# Patient Record
Sex: Male | Born: 1965 | Race: Black or African American | Hispanic: No | Marital: Married | Smoking: Current every day smoker
Health system: Southern US, Community
[De-identification: ages and names within clinical notes are randomized; demographics above are authoritative.]

## PROBLEM LIST (undated history)

## (undated) DIAGNOSIS — M419 Scoliosis, unspecified: Secondary | ICD-10-CM

---

## 2004-09-08 ENCOUNTER — Emergency Department: Payer: Self-pay | Admitting: Emergency Medicine

## 2009-12-06 ENCOUNTER — Emergency Department: Payer: Self-pay | Admitting: Emergency Medicine

## 2011-03-07 ENCOUNTER — Emergency Department: Payer: Self-pay | Admitting: Internal Medicine

## 2012-06-06 ENCOUNTER — Emergency Department: Payer: Self-pay | Admitting: Emergency Medicine

## 2012-06-06 LAB — COMPREHENSIVE METABOLIC PANEL
Albumin: 4.1 g/dL (ref 3.4–5.0)
Alkaline Phosphatase: 71 U/L (ref 50–136)
Anion Gap: 3 — ABNORMAL LOW (ref 7–16)
Bilirubin,Total: 0.9 mg/dL (ref 0.2–1.0)
Calcium, Total: 9.9 mg/dL (ref 8.5–10.1)
Chloride: 106 mmol/L (ref 98–107)
Co2: 29 mmol/L (ref 21–32)
EGFR (African American): >60
EGFR (Non-African Amer.): >60
SGPT (ALT): 22 U/L (ref 12–78)
Sodium: 138 mmol/L (ref 136–145)
Total Protein: 7.8 g/dL (ref 6.4–8.2)

## 2012-06-06 LAB — URINALYSIS, COMPLETE
Blood: NEGATIVE
Glucose,UR: NEGATIVE mg/dL (ref 0–75)
Ketone: NEGATIVE
Nitrite: NEGATIVE
Protein: NEGATIVE
RBC,UR: 1 /HPF (ref 0–5)
Specific Gravity: 1.027 (ref 1.003–1.030)
Squamous Epithelial: <1
WBC UR: <1 /HPF (ref 0–5)

## 2012-06-06 LAB — CBC
HCT: 42.5 % (ref 40.0–52.0)
HGB: 14.1 g/dL (ref 13.0–18.0)
MCH: 30.6 pg (ref 26.0–34.0)
MCHC: 33.3 g/dL (ref 32.0–36.0)
MCV: 92 fL (ref 80–100)
RDW: 12.8 % (ref 11.5–14.5)
WBC: 5.3 10*3/uL (ref 3.8–10.6)

## 2012-06-06 LAB — LIPASE, BLOOD: Lipase: 359 U/L (ref 73–393)

## 2012-06-06 LAB — TROPONIN I: Troponin-I: <0.02 ng/mL

## 2016-05-26 ENCOUNTER — Encounter: Payer: Self-pay | Admitting: Emergency Medicine

## 2016-05-26 ENCOUNTER — Emergency Department
Admission: EM | Admit: 2016-05-26 | Discharge: 2016-05-26 | Disposition: A | Discharge status: Home / Self Care | Payer: BLUE CROSS/BLUE SHIELD | Attending: Emergency Medicine | Admitting: Emergency Medicine

## 2016-05-26 DIAGNOSIS — R110 Nausea: Secondary | ICD-10-CM

## 2016-05-26 DIAGNOSIS — F17200 Nicotine dependence, unspecified, uncomplicated: Secondary | ICD-10-CM | POA: Diagnosis not present

## 2016-05-26 DIAGNOSIS — R1084 Generalized abdominal pain: Secondary | ICD-10-CM | POA: Diagnosis not present

## 2016-05-26 LAB — URINALYSIS, COMPLETE (UACMP) WITH MICROSCOPIC
Bacteria, UA: NONE SEEN
Bilirubin Urine: NEGATIVE
GLUCOSE, UA: NEGATIVE mg/dL
Ketones, ur: NEGATIVE mg/dL
Leukocytes, UA: NEGATIVE
Nitrite: NEGATIVE
PROTEIN: NEGATIVE mg/dL
SPECIFIC GRAVITY, URINE: 1.003 — AB (ref 1.005–1.030)
SQUAMOUS EPITHELIAL / LPF: NONE SEEN
pH: 6 (ref 5.0–8.0)

## 2016-05-26 LAB — COMPREHENSIVE METABOLIC PANEL
ALBUMIN: 4 g/dL (ref 3.5–5.0)
ALK PHOS: 86 U/L (ref 38–126)
ALT: 19 U/L (ref 17–63)
AST: 25 U/L (ref 15–41)
Anion gap: 6 (ref 5–15)
BUN: 10 mg/dL (ref 6–20)
CALCIUM: 8.8 mg/dL — AB (ref 8.9–10.3)
CHLORIDE: 108 mmol/L (ref 101–111)
CO2: 27 mmol/L (ref 22–32)
CREATININE: 1.04 mg/dL (ref 0.61–1.24)
GFR calc Af Amer: >60 mL/min (ref >60)
GFR calc non Af Amer: >60 mL/min (ref >60)
GLUCOSE: 113 mg/dL — AB (ref 65–99)
Potassium: 4.1 mmol/L (ref 3.5–5.1)
SODIUM: 141 mmol/L (ref 135–145)
Total Bilirubin: 0.8 mg/dL (ref 0.3–1.2)
Total Protein: 7.1 g/dL (ref 6.5–8.1)

## 2016-05-26 LAB — CBC
HCT: 41.3 % (ref 40.0–52.0)
Hemoglobin: 13.8 g/dL (ref 13.0–18.0)
MCH: 31.2 pg (ref 26.0–34.0)
MCHC: 33.5 g/dL (ref 32.0–36.0)
MCV: 93.2 fL (ref 80.0–100.0)
PLATELETS: 182 10*3/uL (ref 150–440)
RBC: 4.43 MIL/uL (ref 4.40–5.90)
RDW: 13.2 % (ref 11.5–14.5)
WBC: 5.1 10*3/uL (ref 3.8–10.6)

## 2016-05-26 LAB — LIPASE, BLOOD: Lipase: 23 U/L (ref 11–51)

## 2016-05-26 MED ORDER — ONDANSETRON 4 MG PO TBDP
8.0000 mg | ORAL_TABLET | Freq: Once | ORAL | Status: AC
  Administered 2016-05-26: 8 mg via ORAL
  Filled 2016-05-26: qty 2

## 2016-05-26 MED ORDER — ONDANSETRON 4 MG PO TBDP: 4.0000 mg | ORAL_TABLET | Freq: Three times a day (TID) | ORAL | 0 refills | Status: DC | PRN

## 2016-05-26 NOTE — ED Triage Notes (Signed)
Pt reports generalized abdominal pain, diarrhea and nausea for four days. Pt reports feeling lightheaded as well. No apparent distress noted in triage.Denies vomiting.

## 2016-05-26 NOTE — ED Provider Notes (Signed)
[facility] Emergency Department Provider Note  ____________________________________________   First MD Initiated Contact with Patient 05/26/16 [id number]     (approximate)  I have reviewed the triage vital signs and the nursing notes.   HISTORY  Chief Complaint Abdominal Pain; Diarrhea; and Nausea    HPI Evan Pham is a 51 y.o. male who self presents to the emergency department with 4-5 days of mild diffuse abdominal cramping discomfort. Nothing makes the pain better or worse. It is nonradiating. He has nausea but has not vomited. She's had several loose stools that he feels are improving. He has no abdominal surgical history. He denies chest pain or shortness of breath. He takes omeprazole daily for esophageal reflux although has never been diagnosed via endoscopy.   History reviewed. No pertinent past medical history.  There are no active problems to display for this patient.   History reviewed. No pertinent surgical history.  Prior to Admission medications   Medication Sig Start Date End Date Taking? Authorizing Provider  ondansetron (ZOFRAN ODT) 4 MG disintegrating tablet Take 1 tablet (4 mg total) by mouth every 8 (eight) hours as needed for nausea or vomiting. 05/26/16   Merrily Brittleifenbark, Daekwon Beswick, MD    Allergies Tylenol [acetaminophen]  No family history on file.  Social History Social History  Substance Use Topics  . Smoking status: Current Every Day Smoker  . Smokeless tobacco: Never Used  . Alcohol use No    Review of Systems Constitutional: No fever/chills Eyes: No visual changes. ENT: No sore throat. Cardiovascular: Denies chest pain. Respiratory: Denies shortness of breath. Gastrointestinal: Positive abdominal pain.  Positive nausea, no vomiting.  Positive diarrhea.  No constipation. Genitourinary: Negative for dysuria. Musculoskeletal: Negative for back pain. Skin: Negative for rash. Neurological: Negative for headaches, focal weakness  or numbness.   ____________________________________________   PHYSICAL EXAM:  VITAL SIGNS: ED Triage Vitals [05/26/16 0705]  Enc Vitals Group     BP (!) 117/57     Pulse Rate (!) 58     Resp 18     Temp 98.2 F (36.8 C)     Temp Source Oral     SpO2 100 %     Weight 150 lb (68 kg)     Height 6\' 1"  (1.854 m)     Head Circumference      Peak Flow      Pain Score 5     Pain Loc      Pain Edu?      Excl. in GC?     Constitutional: Alert and oriented x 4 well appearing nontoxic no diaphoresis speaks in full, clear sentences Eyes: PERRL EOMI. Head: Atraumatic. Nose: No congestion/rhinnorhea. Mouth/Throat: No trismus Neck: No stridor.   Cardiovascular: Normal rate, regular rhythm. Grossly normal heart sounds.  Good peripheral circulation. Respiratory: Normal respiratory effort.  No retractions. Lungs CTAB and moving good air Gastrointestinal: Soft nondistended nontender no rebound or guarding no peritonitis no McBurney's tenderness negative Rovsing's no costovertebral tenderness negative Murphy's Musculoskeletal: No lower extremity edema   Neurologic:  Normal speech and language. No gross focal neurologic deficits are appreciated. Skin:  Skin is warm, dry and intact. No rash noted. Psychiatric: Mood and affect are normal. Speech and behavior are normal.    ____________________________________________   DIFFERENTIAL  Biliary colic, cholecystitis, diverticulitis, appendicitis, volvulus, gastric reflux, lower lobar pneumonia ____________________________________________   LABS (all labs ordered are listed, but only abnormal results are displayed)  Labs Reviewed  COMPREHENSIVE METABOLIC PANEL -  Abnormal; Notable for the following:       Result Value   Glucose, Bld 113 (*)    Calcium 8.8 (*)    All other components within normal limits  URINALYSIS, COMPLETE (UACMP) WITH MICROSCOPIC - Abnormal; Notable for the following:    Color, Urine STRAW (*)    APPearance CLEAR  (*)    Specific Gravity, Urine 1.003 (*)    Hgb urine dipstick SMALL (*)    All other components within normal limits  LIPASE, BLOOD  CBC    Labs unremarkable __________________________________________  EKG  ED ECG REPORT I, Merrily Brittle, the attending physician, personally viewed and interpreted this ECG.  Date: 05/26/2016 Rate: 61 Rhythm: normal sinus rhythm QRS Axis: normal Intervals: normal ST/T Wave abnormalities: Upward sloping diffuse ST elevation consistent with benign early re-pole Conduction Disturbances: none Narrative Interpretation: unremarkable  ____________________________________________ ________________________________________   PROCEDURES  Procedure(s) performed: no  Procedures  Critical Care performed: no  Observation: no ____________________________________________   INITIAL IMPRESSION / ASSESSMENT AND PLAN / ED COURSE  Pertinent labs & imaging results that were available during my care of the patient were reviewed by me and considered in my medical decision making (see chart for details).  By the time I saw the patient he had artery had labs which were normal. His abdominal exam is very benign and he is in no pain whatsoever right now. He does report some persistent nausea. We discussed the risks and benefits of CT scanning and ultrasound versus watchful waiting. He is opted for outpatient management with a 2 day abdominal recheck which I think is entirely reasonable. he has no primary care physician swell refer him to one. he is discharged home in improved condition.      ____________________________________________   FINAL CLINICAL IMPRESSION(S) / ED DIAGNOSES  Final diagnoses:  Generalized abdominal pain  Nausea      NEW MEDICATIONS STARTED DURING THIS VISIT:  New Prescriptions   ONDANSETRON (ZOFRAN ODT) 4 MG DISINTEGRATING TABLET    Take 1 tablet (4 mg total) by mouth every 8 (eight) hours as needed for nausea or vomiting.       Note:  This document was prepared using Dragon voice recognition software and may include unintentional dictation errors.     Merrily Brittle, MD 05/26/16 0930

## 2016-05-26 NOTE — Discharge Instructions (Signed)
Please follow-up with the primary care physician on Friday for recheck. Return to the emergency department sooner for any new or worsening symptoms such as fevers, chills, worsening pain, if you cannot eat or drink, or for any other concerns whatsoever.  It was a pleasure to take care of you today, and thank you for coming to our emergency department.  If you have any questions or concerns before leaving please ask the nurse to grab me and I'm more than happy to go through your aftercare instructions again.  If you were prescribed any opioid pain medication today such as Norco, Vicodin, Percocet, morphine, hydrocodone, or oxycodone please make sure you do not drive when you are taking this medication as it can alter your ability to drive safely.  If you have any concerns once you are home that you are not improving or are in fact getting worse before you can make it to your follow-up appointment, please do not hesitate to call 911 and come back for further evaluation.  Merrily BrittleNeil Lander Eslick MD  Results for orders placed or performed during the hospital encounter of 05/26/16  Lipase, blood  Result Value Ref Range   Lipase 23 11 - 51 U/L  Comprehensive metabolic panel  Result Value Ref Range   Sodium 141 135 - 145 mmol/L   Potassium 4.1 3.5 - 5.1 mmol/L   Chloride 108 101 - 111 mmol/L   CO2 27 22 - 32 mmol/L   Glucose, Bld 113 (H) 65 - 99 mg/dL   BUN 10 6 - 20 mg/dL   Creatinine, Ser 1.611.04 0.61 - 1.24 mg/dL   Calcium 8.8 (L) 8.9 - 10.3 mg/dL   Total Protein 7.1 6.5 - 8.1 g/dL   Albumin 4.0 3.5 - 5.0 g/dL   AST 25 15 - 41 U/L   ALT 19 17 - 63 U/L   Alkaline Phosphatase 86 38 - 126 U/L   Total Bilirubin 0.8 0.3 - 1.2 mg/dL   GFR calc non Af Amer >60 >60 mL/min   GFR calc Af Amer >60 >60 mL/min   Anion gap 6 5 - 15  CBC  Result Value Ref Range   WBC 5.1 3.8 - 10.6 K/uL   RBC 4.43 4.40 - 5.90 MIL/uL   Hemoglobin 13.8 13.0 - 18.0 g/dL   HCT 09.641.3 04.540.0 - 40.952.0 %   MCV 93.2 80.0 - 100.0 fL   MCH  31.2 26.0 - 34.0 pg   MCHC 33.5 32.0 - 36.0 g/dL   RDW 81.113.2 91.411.5 - 78.214.5 %   Platelets 182 150 - 440 K/uL  Urinalysis, Complete w Microscopic  Result Value Ref Range   Color, Urine STRAW (A) YELLOW   APPearance CLEAR (A) CLEAR   Specific Gravity, Urine 1.003 (L) 1.005 - 1.030   pH 6.0 5.0 - 8.0   Glucose, UA NEGATIVE NEGATIVE mg/dL   Hgb urine dipstick SMALL (A) NEGATIVE   Bilirubin Urine NEGATIVE NEGATIVE   Ketones, ur NEGATIVE NEGATIVE mg/dL   Protein, ur NEGATIVE NEGATIVE mg/dL   Nitrite NEGATIVE NEGATIVE   Leukocytes, UA NEGATIVE NEGATIVE   RBC / HPF 0-5 0 - 5 RBC/hpf   WBC, UA 0-5 0 - 5 WBC/hpf   Bacteria, UA NONE SEEN NONE SEEN   Squamous Epithelial / LPF NONE SEEN NONE SEEN

## 2016-11-28 ENCOUNTER — Emergency Department
Admission: EM | Admit: 2016-11-28 | Discharge: 2016-11-28 | Disposition: A | Discharge status: Home / Self Care | Payer: BLUE CROSS/BLUE SHIELD | Attending: Emergency Medicine | Admitting: Emergency Medicine

## 2016-11-28 ENCOUNTER — Other Ambulatory Visit: Payer: Self-pay

## 2016-11-28 DIAGNOSIS — F17200 Nicotine dependence, unspecified, uncomplicated: Secondary | ICD-10-CM | POA: Insufficient documentation

## 2016-11-28 DIAGNOSIS — M5440 Lumbago with sciatica, unspecified side: Secondary | ICD-10-CM

## 2016-11-28 DIAGNOSIS — M5441 Lumbago with sciatica, right side: Secondary | ICD-10-CM | POA: Insufficient documentation

## 2016-11-28 DIAGNOSIS — M79661 Pain in right lower leg: Secondary | ICD-10-CM | POA: Diagnosis present

## 2016-11-28 DIAGNOSIS — M62830 Muscle spasm of back: Secondary | ICD-10-CM | POA: Insufficient documentation

## 2016-11-28 DIAGNOSIS — M5431 Sciatica, right side: Secondary | ICD-10-CM

## 2016-11-28 MED ORDER — PREDNISONE 10 MG (21) PO TBPK: ORAL_TABLET | ORAL | 0 refills | Status: DC

## 2016-11-28 MED ORDER — ORPHENADRINE CITRATE ER 100 MG PO TB12: 100.0000 mg | ORAL_TABLET | Freq: Two times a day (BID) | ORAL | 0 refills | Status: AC | PRN

## 2016-11-28 MED ORDER — DEXAMETHASONE SODIUM PHOSPHATE 10 MG/ML IJ SOLN
10.0000 mg | Freq: Once | INTRAMUSCULAR | Status: AC
  Administered 2016-11-28: 10 mg via INTRAMUSCULAR
  Filled 2016-11-28: qty 1

## 2016-11-28 NOTE — ED Provider Notes (Signed)
[facility] Emergency Department Provider Note   ____________________________________________   I have reviewed the triage vital signs and the nursing notes.   HISTORY  Chief Complaint Leg Pain    HPI Evan Pham is a 51 y.o. male presents to the emergency department with lumbar back pain and pain radiating down the right lower extremity to the knee.  Patient denies any recent fall or injuries causing the above symptoms.  Patient has a history of scoliosis and chronic back pain.  Patient reports usual flareups calls right side lower back pain into the glutes that resolves in several days however this episode has worsened with pain radiating into the anterior lateral right lower extremity just above the knee.  Patient denies any sensation or strength changes.  Patient also denies any changes in bowel or bladder function or saddle anesthesia. Patient denies fever, chills, headache, vision changes, chest pain, chest tightness, shortness of breath, abdominal pain, nausea and vomiting.  History reviewed. No pertinent past medical history.  There are no active problems to display for this patient.   History reviewed. No pertinent surgical history.  Prior to Admission medications   Medication Sig Start Date End Date Taking? Authorizing Provider  ondansetron (ZOFRAN ODT) 4 MG disintegrating tablet Take 1 tablet (4 mg total) by mouth every 8 (eight) hours as needed for nausea or vomiting. 05/26/16   Merrily Brittleifenbark, Neil, MD  orphenadrine (NORFLEX) 100 MG tablet Take 1 tablet (100 mg total) 2 (two) times daily as needed for up to 14 days by mouth for muscle spasms. 11/28/16 12/12/16  Little, Traci M, PA-C  predniSONE (STERAPRED UNI-PAK 21 TAB) 10 MG (21) TBPK tablet Take 6 tablets on day 1. Take 5 tablets on day 2. Take 4 tablets on day 3. Take 3 tablets on day 4. Take 2 tablets on day 5. Take 1 tablets on day 6. 11/28/16   Little, Traci M, PA-C    Allergies Tylenol  [acetaminophen]  No family history on file.  Social History Social History   Tobacco Use  . Smoking status: Current Every Day Smoker  . Smokeless tobacco: Never Used  Substance Use Topics  . Alcohol use: No  . Drug use: No    Review of Systems Constitutional: Negative for fever/chills Eyes: No visual changes. Cardiovascular: Denies chest pain. Respiratory:  Denies shortness of breath. Musculoskeletal: Positive for lumbar back pain and right lower extremity pain. Skin: Negative for rash. Neurological: Negative for headaches.  Negative focal weakness or numbness. ____________________________________________   PHYSICAL EXAM:  VITAL SIGNS: Patient Vitals for the past 24 hrs:  BP Temp Temp src Pulse Resp SpO2 Height Weight  11/28/16 1304 128/84 98.1 F (36.7 C) Oral 64 16 100 % - -  11/28/16 1103 (!) 145/77 (!) 97.5 F (36.4 C) Oral 70 17 100 % 6\' 1"  (1.854 m) 68 kg (150 lb)    Constitutional: Alert and oriented. Well appearing and in no acute distress.  Eyes: Conjunctivae are normal. PERRL. Head: Normocephalic and atraumatic. Cardiovascular: Normal rate, regular rhythm.  Good peripheral circulation. Respiratory: Normal respiratory effort without tachypnea or retractions.  Musculoskeletal: Thoracolumbar scoliosis present.  Tenderness along the lumbar spinous process.  Palpable tenderness along lumbar paraspinals glut medius. Right lower extremity radicular pain to the knee without changes in sensation or strength.  Neurologic: Normal speech and language.  Skin:  Skin is warm, dry and intact. No rash noted. Psychiatric: Mood and affect are normal. Speech and behavior are normal. Patient exhibits  appropriate insight and judgement.  ____________________________________________   LABS (all labs ordered are listed, but only abnormal results are displayed)  Labs Reviewed - No data to  display ____________________________________________  EKG none ____________________________________________  RADIOLOGY none ____________________________________________   PROCEDURES  Procedure(s) performed: no    Critical Care performed: no ____________________________________________   INITIAL IMPRESSION / ASSESSMENT AND PLAN / ED COURSE  Pertinent labs & imaging results that were available during my care of the patient were reviewed by me and considered in my medical decision making (see chart for details).  Patient presents to emergency department with lumbar back and right lower extremity pain without trauma. History and physical exam findings are reassuring symptoms are consistent with exacerbation of lumbar back pain with sciatica. Patient noted improvement of symptoms following decadron and norflex given during the course of care in the emergency department. Patient will be prescribed prednisone taper and norflex as needed for muscle spasms. Patient advised to follow up with his neurosurgeon spine provider or return to the emergency department if symptoms significantly worsened. Patient informed of clinical course, understand medical decision-making process, and agree with plan.  ____________________________________________   FINAL CLINICAL IMPRESSION(S) / ED DIAGNOSES  Final diagnoses:  Back pain of lumbar region with sciatica  Sciatica of right side  Spasm of muscle of lower back       NEW MEDICATIONS STARTED DURING THIS VISIT:  This SmartLink is deprecated. Use AVSMEDLIST instead to display the medication list for a patient.   Note:  This document was prepared using Dragon voice recognition software and may include unintentional dictation errors.    Clois ComberLittle, Traci M, PA-C 11/28/16 1604    Clois ComberLittle, Traci M, PA-C 11/28/16 1605    Schaevitz, Myra Rudeavid Matthew, MD 11/29/16 Serena [id number]

## 2016-11-28 NOTE — Discharge Instructions (Signed)
Take medication as prescribed. Return to emergency department if symptoms significantly worsen

## 2016-11-28 NOTE — ED Triage Notes (Signed)
Pt c/o right upper leg pain for the past 2 weeks. Denies injury. States he has a lot of back pain that is chronic

## 2016-11-28 NOTE — ED Notes (Signed)
Patient does not appear to be in any acute distress at time of discharge. Patient ambulatory to lobby with steady gate. Patient denies any comments or concerns regarding discharge.  

## 2016-11-28 NOTE — ED Notes (Signed)
Reports he has scoliosis, right upper right leg pain 9/10, "feels like my veins are popping out"

## 2017-03-24 ENCOUNTER — Other Ambulatory Visit: Payer: Self-pay

## 2017-03-24 ENCOUNTER — Ambulatory Visit (HOSPITAL_COMMUNITY)
Admission: EM | Admit: 2017-03-24 | Discharge: 2017-03-24 | Disposition: A | Discharge status: Home / Self Care | Payer: BLUE CROSS/BLUE SHIELD | Attending: Internal Medicine | Admitting: Internal Medicine

## 2017-03-24 ENCOUNTER — Encounter (HOSPITAL_COMMUNITY): Payer: Self-pay | Admitting: Emergency Medicine

## 2017-03-24 DIAGNOSIS — K0889 Other specified disorders of teeth and supporting structures: Secondary | ICD-10-CM

## 2017-03-24 HISTORY — DX: Scoliosis, unspecified: M41.9

## 2017-03-24 MED ORDER — TRAMADOL HCL 50 MG PO TABS: 50.0000 mg | ORAL_TABLET | Freq: Four times a day (QID) | ORAL | 0 refills | Status: DC | PRN

## 2017-03-24 NOTE — ED Provider Notes (Signed)
[facility]    CSN: [id number] Arrival date & time: 03/24/17  1013     History   Chief Complaint Chief Complaint  Patient presents with  . Dental Pain    HPI Evan Pham is a 52 y.o. male no concerning past medical history presenting today with dental pain.  He states that for the past 1-2 weeks he has had pain in his right lower wisdom tooth.  States that this tooth has been bad for a while, but recently worsened while he was eating food.  Patient does have dental insurance, and has an appointment this upcoming Monday to be evaluated by dentistry.  He denies any swelling or facial swelling.  He is having headaches.  Has been using Advil over-the-counter for pain.  HPI  Past Medical History:  Diagnosis Date  . Scoliosis     There are no active problems to display for this patient.   History reviewed. No pertinent surgical history.     Home Medications    Prior to Admission medications   Medication Sig Start Date End Date Taking? Authorizing Provider  ondansetron (ZOFRAN ODT) 4 MG disintegrating tablet Take 1 tablet (4 mg total) by mouth every 8 (eight) hours as needed for nausea or vomiting. 05/26/16   Merrily Brittle, MD  predniSONE (STERAPRED UNI-PAK 21 TAB) 10 MG (21) TBPK tablet Take 6 tablets on day 1. Take 5 tablets on day 2. Take 4 tablets on day 3. Take 3 tablets on day 4. Take 2 tablets on day 5. Take 1 tablets on day 6. 11/28/16   Little, Traci M, PA-C  traMADol (ULTRAM) 50 MG tablet Take 1 tablet (50 mg total) by mouth every 6 (six) hours as needed for severe pain. 03/24/17   Wieters, Junius Creamer, PA-C    Family History History reviewed. No pertinent family history.  Social History Social History   Tobacco Use  . Smoking status: Current Every Day Smoker  . Smokeless tobacco: Never Used  Substance Use Topics  . Alcohol use: No  . Drug use: No     Allergies   Tylenol [acetaminophen]   Review of Systems Review of Systems  Constitutional:  Negative for fatigue and fever.  HENT: Positive for dental problem. Negative for sore throat and trouble swallowing.   Respiratory: Negative for shortness of breath.   Cardiovascular: Negative for chest pain.  Gastrointestinal: Negative for nausea and vomiting.  Neurological: Positive for headaches. Negative for dizziness and light-headedness.     Physical Exam Triage Vital Signs ED Triage Vitals  Enc Vitals Group     BP 03/24/17 1059 127/78     Pulse Rate 03/24/17 1059 84     Resp 03/24/17 1059 18     Temp 03/24/17 1059 98.4 F (36.9 C)     Temp Source 03/24/17 1059 Oral     SpO2 03/24/17 1059 99 %     Weight --      Height --      Head Circumference --      Peak Flow --      Pain Score 03/24/17 1057 10     Pain Loc --      Pain Edu? --      Excl. in GC? --    No data found.  Updated Vital Signs BP 127/78 (BP Location: Left Arm)   Pulse 84   Temp 98.4 F (36.9 C) (Oral)   Resp 18   SpO2 99%   Visual Acuity Right Eye  Distance:   Left Eye Distance:   Bilateral Distance:    Right Eye Near:   Left Eye Near:    Bilateral Near:     Physical Exam  Constitutional: He appears well-developed and well-nourished.  HENT:  Head: Normocephalic and atraumatic.  Patient with poor dentition, right back molar with discoloration, no fracture observed.  Noted gingival swelling or tenderness to palpation of surrounding gums.  Eyes: Conjunctivae are normal.  Neck: Neck supple.  Cardiovascular: Normal rate.  Pulmonary/Chest: Effort normal and breath sounds normal. No respiratory distress.  Musculoskeletal: He exhibits no edema.  Neurological: He is alert.  Skin: Skin is warm and dry.  Psychiatric: He has a normal mood and affect.  Nursing note and vitals reviewed.    UC Treatments / Results  Labs (all labs ordered are listed, but only abnormal results are displayed) Labs Reviewed - No data to display  EKG  EKG Interpretation None       Radiology No results  found.  Procedures Procedures (including critical care time)  Medications Ordered in UC Medications - No data to display   Initial Impression / Assessment and Plan / UC Course  I have reviewed the triage vital signs and the nursing notes.  Pertinent labs & imaging results that were available during my care of the patient were reviewed by me and considered in my medical decision making (see chart for details).     Patient with dental pain, has plans to follow-up with dentistry.  Patient does not appear to have dental abscess or infection at this time.  Will treat pain with NSAIDs for mild to moderate.  Will provide tramadol for severe pain. Discussed strict return precautions. Patient verbalized understanding and is agreeable with plan.   Final Clinical Impressions(s) / UC Diagnoses   Final diagnoses:  Pain, dental    ED Discharge Orders        Ordered    traMADol (ULTRAM) 50 MG tablet  Every 6 hours PRN     03/24/17 1133       Controlled Substance Prescriptions [place] Controlled Substance Registry consulted? Yes, I have consulted the [place] Controlled Substances Registry for this patient, and feel the risk/benefit ratio today is favorable for proceeding with this prescription for a controlled substance.  Patient had no prescriptions in registry.   Lew DawesWieters, Hallie C, [place]PA-C 03/24/17 1138

## 2017-03-24 NOTE — ED Triage Notes (Signed)
Dental pain and headache for a week. Patient reports right bottom wisdom tooth is painful

## 2017-03-24 NOTE — Discharge Instructions (Signed)
Please follow-up on Monday with appointment that you have scheduled with dentistry.  For pain please take 600mg -800mg  of Ibuprofen every 8 hours, Please take with food.   I have also provided some stronger pain medication. This should only be used for severe pain. Do not drive or operate machinery while taking this medication.   Please return if you start to experience significant swelling of your face, experiencing fever.

## 2017-06-13 ENCOUNTER — Other Ambulatory Visit: Payer: Self-pay

## 2017-06-13 ENCOUNTER — Encounter: Payer: Self-pay | Admitting: Emergency Medicine

## 2017-06-13 DIAGNOSIS — S4992XA Unspecified injury of left shoulder and upper arm, initial encounter: Secondary | ICD-10-CM | POA: Diagnosis present

## 2017-06-13 DIAGNOSIS — Y999 Unspecified external cause status: Secondary | ICD-10-CM | POA: Insufficient documentation

## 2017-06-13 DIAGNOSIS — Z79899 Other long term (current) drug therapy: Secondary | ICD-10-CM | POA: Insufficient documentation

## 2017-06-13 DIAGNOSIS — Y939 Activity, unspecified: Secondary | ICD-10-CM | POA: Insufficient documentation

## 2017-06-13 DIAGNOSIS — Y929 Unspecified place or not applicable: Secondary | ICD-10-CM | POA: Diagnosis not present

## 2017-06-13 DIAGNOSIS — F17210 Nicotine dependence, cigarettes, uncomplicated: Secondary | ICD-10-CM | POA: Insufficient documentation

## 2017-06-13 DIAGNOSIS — X500XXA Overexertion from strenuous movement or load, initial encounter: Secondary | ICD-10-CM | POA: Diagnosis not present

## 2017-06-13 DIAGNOSIS — S46312A Strain of muscle, fascia and tendon of triceps, left arm, initial encounter: Secondary | ICD-10-CM | POA: Insufficient documentation

## 2017-06-13 NOTE — ED Triage Notes (Signed)
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

## 2017-06-14 ENCOUNTER — Emergency Department
Admission: EM | Admit: 2017-06-14 | Discharge: 2017-06-14 | Disposition: A | Discharge status: Home / Self Care | Payer: Managed Care, Other (non HMO) | Attending: Emergency Medicine | Admitting: Emergency Medicine

## 2017-06-14 ENCOUNTER — Emergency Department: Payer: Managed Care, Other (non HMO)

## 2017-06-14 DIAGNOSIS — S46319A Strain of muscle, fascia and tendon of triceps, unspecified arm, initial encounter: Secondary | ICD-10-CM

## 2017-06-14 MED ORDER — TRAMADOL HCL 50 MG PO TABS: 50.0000 mg | ORAL_TABLET | Freq: Four times a day (QID) | ORAL | 0 refills | Status: DC | PRN

## 2017-06-14 MED ORDER — KETOROLAC TROMETHAMINE 60 MG/2ML IM SOLN
60.0000 mg | Freq: Once | INTRAMUSCULAR | Status: AC
  Administered 2017-06-14: 60 mg via INTRAMUSCULAR

## 2017-06-14 MED ORDER — KETOROLAC TROMETHAMINE 60 MG/2ML IM SOLN
INTRAMUSCULAR | Status: AC
  Filled 2017-06-14: qty 2

## 2017-06-14 NOTE — Discharge Instructions (Addendum)
Please follow up with orthopedics to evaluate your injury

## 2017-06-14 NOTE — ED Provider Notes (Signed)
[facility] Emergency Department Provider Note   ____________________________________________   First MD Initiated Contact with Patient 06/14/17 0031     (approximate)  I have reviewed the triage vital signs and the nursing notes.   HISTORY  Chief Complaint Arm Injury    HPI KYMERE Pham is a 52 y.o. male who comes into the hospital today with some left arm pain.  He reports that he hurt his left posterior arm lifting boxes 2 weeks ago.  He states that he was throwing them and felt a pop in the back of his arm.  He reports that today he had some difficulty sleeping and has had some numbness and tingling in his arm and his hand.  He has some difficulty moving his arm and was concerned.  He wanted to see if there was something wrong given his symptoms.  The patient rates his pain a 6 out of 10 in intensity currently.  It is worse at night.  The patient states that the symptoms come and go.  He is here tonight for evaluation.   Past Medical History:  Diagnosis Date  . Scoliosis     There are no active problems to display for this patient.   History reviewed. No pertinent surgical history.  Prior to Admission medications   Medication Sig Start Date End Date Taking? Authorizing Provider  ondansetron (ZOFRAN ODT) 4 MG disintegrating tablet Take 1 tablet (4 mg total) by mouth every 8 (eight) hours as needed for nausea or vomiting. 05/26/16   Merrily Brittle, MD  predniSONE (STERAPRED UNI-PAK 21 TAB) 10 MG (21) TBPK tablet Take 6 tablets on day 1. Take 5 tablets on day 2. Take 4 tablets on day 3. Take 3 tablets on day 4. Take 2 tablets on day 5. Take 1 tablets on day 6. 11/28/16   Little, Traci M, PA-C  traMADol (ULTRAM) 50 MG tablet Take 1 tablet (50 mg total) by mouth every 6 (six) hours as needed for severe pain. 03/24/17   Wieters, Hallie C, PA-C  traMADol (ULTRAM) 50 MG tablet Take 1 tablet (50 mg total) by mouth every 6 (six) hours as needed. 06/14/17    Rebecka Apley, MD    Allergies Tylenol [acetaminophen]  No family history on file.  Social History Social History   Tobacco Use  . Smoking status: Current Every Day Smoker  . Smokeless tobacco: Never Used  Substance Use Topics  . Alcohol use: No  . Drug use: No    Review of Systems  Constitutional: No fever/chills Eyes: No visual changes. ENT: No sore throat. Cardiovascular: Denies chest pain. Respiratory: Denies shortness of breath. Gastrointestinal: No abdominal pain.  No nausea, no vomiting.  No diarrhea.  No constipation. Genitourinary: Negative for dysuria. Musculoskeletal: Left arm pain Skin: Negative for rash. Neurological: Negative for headaches, focal weakness or numbness.   ____________________________________________   PHYSICAL EXAM:  VITAL SIGNS: ED Triage Vitals  Enc Vitals Group     BP 06/13/17 2356 130/84     Pulse Rate 06/13/17 2356 66     Resp 06/13/17 2356 18     Temp 06/13/17 2356 98.2 F (36.8 C)     Temp Source 06/13/17 2356 Oral     SpO2 06/13/17 2356 99 %     Weight 06/13/17 2355 150 lb (68 kg)     Height 06/13/17 2355 6\' 1"  (1.854 m)     Head Circumference --      Peak Flow --  Pain Score 06/13/17 2355 5     Pain Loc --      Pain Edu? --      Excl. in GC? --     Constitutional: Alert and oriented. Well appearing and in moderate distress. Eyes: Conjunctivae are normal. PERRL. EOMI. Head: Atraumatic. Nose: No congestion/rhinnorhea. Mouth/Throat: Mucous membranes are moist.  Oropharynx non-erythematous. Cardiovascular: Normal rate, regular rhythm. Grossly normal heart sounds.  Good peripheral circulation. Respiratory: Normal respiratory effort.  No retractions. Lungs CTAB. Gastrointestinal: Soft and nontender. No distention.  Musculoskeletal: No distinct soft tissue swelling or muscular swelling to left upper extremity.  Patient does have some tricep with extension of forearm and he does have a tender firm spot to his  triceps.,   Neurologic:  Normal speech and language. No gross focal neurologic deficits are appreciated. No gait instability.  Sensation and strength intact throughout Skin:  Skin is warm, dry and intact.  Left upper extremity bruising noted no  Psychiatric: Mood and affect are normal. Speech and behavior are normal.  ____________________________________________   LABS (all labs ordered are listed, but only abnormal results are displayed)  Labs Reviewed - No data to display ____________________________________________  EKG  none ____________________________________________  RADIOLOGY  ED MD interpretation:  DG left humerus: Negative radiographs of the humerus  Official radiology report(s): Dg Humerus Left  Result Date: 06/14/2017 CLINICAL DATA:  Left arm pain. Felt a pop 2 weeks ago while lifting boxes. EXAM: LEFT HUMERUS - 2+ VIEW COMPARISON:  None. FINDINGS: There is no evidence of fracture or other focal bone lesions. Elbow and shoulder alignment are maintained. Soft tissues are unremarkable. IMPRESSION: Negative radiographs of the left humerus. Electronically Signed   By: Rubye OaksMelanie  Ehinger M.D.   On: 06/14/2017 01:23    ____________________________________________   PROCEDURES  Procedure(s) performed: None  Procedures  Critical Care performed: No  ____________________________________________   INITIAL IMPRESSION / ASSESSMENT AND PLAN / ED COURSE  As part of my medical decision making, I reviewed the following data within the electronic MEDICAL RECORD NUMBER Notes from prior ED visits and [place] Controlled Substance Database   This is a 52 year old male who comes into the hospital today with some left tricep pain.  The patient states that he felt a pop when he was lifting boxes at work 2 weeks ago.  My differential diagnosis includes tendon rupture, tendinopathy, contusion, muscle strain  I sent the patient for an x-ray which was negative.  The patient does not have an  area of contracted muscle and he still has flexion and extension intact.  I feel that he may have some muscle strain or tendinitis.  The patient should follow-up with orthopedic surgery for further evaluation.  He has no other concerns.  I did give the patient a shot of Toradol 60 mg IM x1 for his pain.  He will be discharged home.      ____________________________________________   FINAL CLINICAL IMPRESSION(S) / ED DIAGNOSES  Final diagnoses:  Triceps strain, initial encounter     ED Discharge Orders        Ordered    traMADol (ULTRAM) 50 MG tablet  Every 6 hours PRN     06/14/17 0229       Note:  This document was prepared using Dragon voice recognition software and may include unintentional dictation errors.    Rebecka ApleyWebster, Allison P, MD 06/14/17 [phone]

## 2017-06-14 NOTE — ED Notes (Signed)
Pt has left upper arm pain. Pt states he was lifting boxes 2 weeks ago and felt a pop in his arm.  No swelling noted.  Good distal pulses.  Taking otc meds without pain relief.

## 2021-04-08 ENCOUNTER — Other Ambulatory Visit: Payer: Self-pay

## 2021-04-08 ENCOUNTER — Encounter: Payer: Self-pay | Admitting: Intensive Care

## 2021-04-08 ENCOUNTER — Emergency Department: Payer: No Typology Code available for payment source

## 2021-04-08 ENCOUNTER — Emergency Department
Admission: EM | Admit: 2021-04-08 | Discharge: 2021-04-08 | Disposition: A | Discharge status: Home / Self Care | Payer: No Typology Code available for payment source | Attending: Emergency Medicine | Admitting: Emergency Medicine

## 2021-04-08 DIAGNOSIS — Y92410 Unspecified street and highway as the place of occurrence of the external cause: Secondary | ICD-10-CM | POA: Diagnosis not present

## 2021-04-08 DIAGNOSIS — M79661 Pain in right lower leg: Secondary | ICD-10-CM | POA: Insufficient documentation

## 2021-04-08 DIAGNOSIS — S8991XA Unspecified injury of right lower leg, initial encounter: Secondary | ICD-10-CM | POA: Insufficient documentation

## 2021-04-08 MED ORDER — NAPROXEN 500 MG PO TABS: 500.0000 mg | ORAL_TABLET | Freq: Two times a day (BID) | ORAL | 0 refills | Status: AC

## 2021-04-08 MED ORDER — METHOCARBAMOL 500 MG PO TABS: 500.0000 mg | ORAL_TABLET | Freq: Four times a day (QID) | ORAL | 0 refills | Status: AC
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
## 2021-04-08 NOTE — Discharge Instructions (Signed)
Rest, ice 20 minutes per hour while awake, wear the ACE for comfort. ?Return to the ER for symptoms of concern if unable to see primary care. ?

## 2021-10-29 ENCOUNTER — Encounter: Payer: Self-pay | Admitting: Emergency Medicine

## 2021-10-29 ENCOUNTER — Other Ambulatory Visit: Payer: Self-pay

## 2021-10-29 DIAGNOSIS — H1089 Other conjunctivitis: Secondary | ICD-10-CM | POA: Insufficient documentation

## 2021-10-29 DIAGNOSIS — R300 Dysuria: Secondary | ICD-10-CM | POA: Insufficient documentation

## 2021-10-29 DIAGNOSIS — A568 Sexually transmitted chlamydial infection of other sites: Secondary | ICD-10-CM | POA: Insufficient documentation

## 2021-10-29 DIAGNOSIS — B9689 Other specified bacterial agents as the cause of diseases classified elsewhere: Secondary | ICD-10-CM | POA: Insufficient documentation

## 2021-10-29 NOTE — ED Triage Notes (Signed)
Patient ambulatory to triage with steady gait, without difficulty or distress noted; pt reports rt eye pain x week with no known cause

## 2021-10-30 ENCOUNTER — Emergency Department
Admission: EM | Admit: 2021-10-30 | Discharge: 2021-10-30 | Disposition: A | Discharge status: Home / Self Care | Payer: Self-pay | Attending: Emergency Medicine | Admitting: Emergency Medicine

## 2021-10-30 DIAGNOSIS — A64 Unspecified sexually transmitted disease: Secondary | ICD-10-CM

## 2021-10-30 DIAGNOSIS — H1031 Unspecified acute conjunctivitis, right eye: Secondary | ICD-10-CM

## 2021-10-30 DIAGNOSIS — R300 Dysuria: Secondary | ICD-10-CM

## 2021-10-30 LAB — URINALYSIS, ROUTINE W REFLEX MICROSCOPIC
Bilirubin Urine: NEGATIVE
Glucose, UA: NEGATIVE mg/dL
Ketones, ur: NEGATIVE mg/dL
Nitrite: NEGATIVE
Protein, ur: NEGATIVE mg/dL
Specific Gravity, Urine: 1.026 (ref 1.005–1.030)
WBC, UA: >50 WBC/hpf — ABNORMAL HIGH (ref 0–5)
pH: 6 (ref 5.0–8.0)

## 2021-10-30 LAB — CHLAMYDIA/NGC RT PCR (ARMC ONLY)
Chlamydia Tr: DETECTED — AB
N gonorrhoeae: DETECTED — AB

## 2021-10-30 MED ORDER — CEFTRIAXONE SODIUM 1 G IJ SOLR
500.0000 mg | Freq: Once | INTRAMUSCULAR | Status: AC
  Administered 2021-10-30: 500 mg via INTRAMUSCULAR
  Filled 2021-10-30: qty 10

## 2021-10-30 MED ORDER — DOXYCYCLINE HYCLATE 100 MG PO TABS: 100.0000 mg | ORAL_TABLET | Freq: Two times a day (BID) | ORAL | 0 refills | Status: AC

## 2021-10-30 MED ORDER — DOXYCYCLINE HYCLATE 100 MG PO TABS
100.0000 mg | ORAL_TABLET | Freq: Once | ORAL | Status: AC
  Administered 2021-10-30: 100 mg via ORAL
  Filled 2021-10-30: qty 1

## 2021-10-30 NOTE — ED Notes (Signed)
E-signature pad unavailable - Pt verbalized understanding of D/C information - no additional concerns at this time.  

## 2021-10-30 NOTE — ED Provider Notes (Signed)
[facility] Provider Note    Event Date/Time   First MD Initiated Contact with Patient 10/30/21 0404     (approximate)   History   Eye Pain   HPI  Evan Pham is a 56 y.o. male who presents to the ED for evaluation of Eye Pain   Patient presents to the ED for evaluation of 1-2 days of erythema, pain and discharge from his right eye.  Reports no initial trauma.  Denies any vision changes or vision loss.  No upper respiratory congestion or other respiratory symptoms.  No chest pain or shortness of breath or cough.  As I am about to walk away, he does reluctantly tell me that has been having dysuria and penile discharge for the past week and is requesting testing for an STD.  No fevers or abdominal pain.  No history of acute cystitis or prostate issues that he is aware of.  No diarrhea.  Physical Exam   Triage Vital Signs: ED Triage Vitals  Enc Vitals Group     BP 10/30/21 0007 120/82     Pulse Rate 10/30/21 0007 97     Resp 10/30/21 0007 16     Temp 10/30/21 0007 98 F (36.7 C)     Temp Source 10/30/21 0007 Oral     SpO2 10/30/21 0007 100 %     Weight 10/29/21 2343 150 lb (68 kg)     Height 10/29/21 2343 6\' 1"  (1.854 m)     Head Circumference --      Peak Flow --      Pain Score 10/29/21 2343 8     Pain Loc --      Pain Edu? --      Excl. in GC? --     Most recent vital signs: Vitals:   10/30/21 0007  BP: 120/82  Pulse: 97  Resp: 16  Temp: 98 F (36.7 C)  SpO2: 100%    General: Awake, no distress.  CV:  Good peripheral perfusion.  Resp:  Normal effort.  Abd:  No distention.  No suprapubic tenderness or any abdominal tenderness throughout.  No peritoneal features. MSK:  No deformity noted.  Neuro:  No focal deficits appreciated. Other:  Right-sided conjunctivitis is purulent.  Pupil is PERRL, visual acuity intact.  No signs of periorbital erythema, edema, cellulitis   ED Results / Procedures / Treatments   Labs (all labs ordered  are listed, but only abnormal results are displayed) Labs Reviewed  URINE CULTURE  CHLAMYDIA/NGC RT PCR ([facility] ONLY)            URINALYSIS, ROUTINE W REFLEX MICROSCOPIC    EKG   RADIOLOGY   Official radiology report(s): No results found.  PROCEDURES and INTERVENTIONS:  Procedures  Medications  doxycycline (VIBRA-TABS) tablet 100 mg (has no administration in time range)  cefTRIAXone (ROCEPHIN) injection 500 mg (has no administration in time range)     IMPRESSION / MDM / ASSESSMENT AND PLAN / ED COURSE  I reviewed the triage vital signs and the nursing notes.  Differential diagnosis includes, but is not limited to, viral conjunctivitis, URI, bacterial conjunctivitis, chlamydia, gonorrhea  {Patient presents with symptoms of an acute illness or injury that is potentially life-threatening.  55 year old male presents to the ED with conjunctivitis and dysuria with concerns for an STI.  Will treat empirically for gonorrhea and chlamydia with ceftriaxone and doxycycline, respectively.  We will acquire UA, send for GC and culture.  Plan to discharge  with a course of doxycycline.     FINAL CLINICAL IMPRESSION(S) / ED DIAGNOSES   Final diagnoses:  Acute bacterial conjunctivitis of right eye  Dysuria  STI (sexually transmitted infection)     Rx / DC Orders   ED Discharge Orders     None        Note:  This document was prepared using Dragon voice recognition software and may include unintentional dictation errors.   Vladimir Crofts, MD 10/30/21 [phone]

## 2021-10-30 NOTE — Discharge Instructions (Signed)
Take the doxycycline antibiotic 2 times daily for the next 7 days.

## 2021-10-31 LAB — URINE CULTURE: Culture: NO GROWTH

## 2021-11-03 ENCOUNTER — Telehealth: Payer: Self-pay | Admitting: Emergency Medicine

## 2021-11-03 NOTE — Telephone Encounter (Signed)
Called patient to inform of STD results.  The number is not working.  Will send letter.
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
